# Patient Record
Sex: Female | Born: 1960 | Race: Black or African American | Hispanic: No | Marital: Single | Smoking: Never smoker
Health system: Southern US, Community
[De-identification: ages and names within clinical notes are randomized; demographics above are authoritative.]

---

## 1997-11-20 ENCOUNTER — Ambulatory Visit (HOSPITAL_BASED_OUTPATIENT_CLINIC_OR_DEPARTMENT_OTHER): Admission: RE | Admit: 1997-11-20 | Discharge: 1997-11-20 | Payer: Self-pay | Admitting: Ophthalmology

## 1997-12-10 ENCOUNTER — Other Ambulatory Visit: Admission: RE | Admit: 1997-12-10 | Discharge: 1997-12-10 | Payer: Self-pay | Admitting: Obstetrics and Gynecology

## 1999-07-12 ENCOUNTER — Other Ambulatory Visit: Admission: RE | Admit: 1999-07-12 | Discharge: 1999-07-12 | Payer: Self-pay | Admitting: Obstetrics and Gynecology

## 1999-11-25 ENCOUNTER — Ambulatory Visit (HOSPITAL_BASED_OUTPATIENT_CLINIC_OR_DEPARTMENT_OTHER): Admission: RE | Admit: 1999-11-25 | Discharge: 1999-11-25 | Payer: Self-pay | Admitting: Surgery

## 1999-11-25 ENCOUNTER — Encounter (INDEPENDENT_AMBULATORY_CARE_PROVIDER_SITE_OTHER): Payer: Self-pay | Admitting: *Deleted

## 2003-05-01 ENCOUNTER — Encounter: Admission: RE | Admit: 2003-05-01 | Discharge: 2003-07-16 | Payer: Self-pay | Admitting: Orthopedic Surgery

## 2003-10-31 ENCOUNTER — Encounter: Admission: RE | Admit: 2003-10-31 | Discharge: 2003-10-31 | Payer: Self-pay | Admitting: Orthopedic Surgery

## 2004-07-19 ENCOUNTER — Ambulatory Visit: Payer: Self-pay | Admitting: Internal Medicine

## 2004-08-01 ENCOUNTER — Ambulatory Visit: Payer: Self-pay | Admitting: Internal Medicine

## 2004-08-08 ENCOUNTER — Ambulatory Visit: Payer: Self-pay | Admitting: Internal Medicine

## 2004-08-09 LAB — CONVERTED CEMR LAB: Pap Smear: NORMAL

## 2004-08-23 ENCOUNTER — Encounter: Admission: RE | Admit: 2004-08-23 | Discharge: 2004-08-23 | Payer: Self-pay | Admitting: Internal Medicine

## 2004-08-25 ENCOUNTER — Other Ambulatory Visit: Admission: RE | Admit: 2004-08-25 | Discharge: 2004-08-25 | Payer: Self-pay | Admitting: Endocrinology

## 2004-08-25 ENCOUNTER — Ambulatory Visit: Payer: Self-pay | Admitting: Internal Medicine

## 2007-01-29 ENCOUNTER — Ambulatory Visit: Payer: Self-pay | Admitting: Internal Medicine

## 2007-01-29 LAB — CONVERTED CEMR LAB
ALT: 11 units/L (ref 0–35)
AST: 20 units/L (ref 0–37)
Bilirubin Urine: NEGATIVE
Calcium: 8.7 mg/dL (ref 8.4–10.5)
Cholesterol: 171 mg/dL (ref 0–200)
Eosinophils Absolute: 0.1 10*3/uL (ref 0.0–0.6)
GFR calc Af Amer: 138 mL/min
GFR calc non Af Amer: 114 mL/min
Glucose, Bld: 88 mg/dL (ref 70–99)
HCT: 30.8 % — ABNORMAL LOW (ref 36.0–46.0)
HDL: 65.3 mg/dL (ref >39.0)
Hemoglobin: 10.3 g/dL — ABNORMAL LOW (ref 12.0–15.0)
Ketones, ur: 15 mg/dL — AB
LDL Cholesterol: 93 mg/dL (ref 0–99)
Lymphocytes Relative: 32.2 % (ref 12.0–46.0)
Monocytes Absolute: 0.8 10*3/uL — ABNORMAL HIGH (ref 0.2–0.7)
Neutro Abs: 3.3 10*3/uL (ref 1.4–7.7)
Platelets: 340 10*3/uL (ref 150–400)
RBC: 3.45 M/uL — ABNORMAL LOW (ref 3.87–5.11)
Sodium: 141 meq/L (ref 135–145)
Total Bilirubin: 0.5 mg/dL (ref 0.3–1.2)
Total Protein: 6.3 g/dL (ref 6.0–8.3)
Triglycerides: 66 mg/dL (ref 0–149)
Urobilinogen, UA: 0.2 (ref 0.0–1.0)
VLDL: 13 mg/dL (ref 0–40)
pH: 6 (ref 5.0–8.0)

## 2007-01-30 ENCOUNTER — Ambulatory Visit: Payer: Self-pay | Admitting: Internal Medicine

## 2007-01-30 LAB — CONVERTED CEMR LAB
Folate: 9.2 ng/mL
Iron: 82 ug/dL (ref 42–145)
Retic Count, Absolute: 51.1 (ref 19.0–186.0)
Retic Ct Pct: 1.3 % (ref 0.4–3.1)
Saturation Ratios: 18.3 % — ABNORMAL LOW (ref 20.0–50.0)
Transferrin: 320.2 mg/dL (ref >=212.0)
Vitamin B-12: 472 pg/mL (ref 211–911)

## 2007-03-01 ENCOUNTER — Ambulatory Visit: Payer: Self-pay | Admitting: Internal Medicine

## 2008-07-30 ENCOUNTER — Ambulatory Visit: Payer: Self-pay | Admitting: *Deleted

## 2008-07-30 DIAGNOSIS — D509 Iron deficiency anemia, unspecified: Secondary | ICD-10-CM

## 2008-07-30 DIAGNOSIS — J069 Acute upper respiratory infection, unspecified: Secondary | ICD-10-CM | POA: Insufficient documentation

## 2008-07-30 LAB — CONVERTED CEMR LAB: Rapid Strep: NEGATIVE

## 2011-11-01 ENCOUNTER — Other Ambulatory Visit (INDEPENDENT_AMBULATORY_CARE_PROVIDER_SITE_OTHER): Payer: Self-pay

## 2011-11-01 DIAGNOSIS — Z0000 Encounter for general adult medical examination without abnormal findings: Secondary | ICD-10-CM

## 2011-11-01 LAB — LIPID PANEL
Cholesterol: 177 mg/dL (ref 0–200)
Total CHOL/HDL Ratio: 2
Triglycerides: 34 mg/dL (ref 0.0–149.0)
VLDL: 6.8 mg/dL (ref 0.0–40.0)

## 2011-11-01 LAB — POCT URINALYSIS DIPSTICK
Bilirubin, UA: NEGATIVE
Glucose, UA: NEGATIVE
Ketones, UA: NEGATIVE
Leukocytes, UA: NEGATIVE
Nitrite, UA: NEGATIVE
Protein, UA: NEGATIVE
Spec Grav, UA: 1.015
Urobilinogen, UA: 0.2

## 2011-11-01 LAB — HEPATIC FUNCTION PANEL: Total Bilirubin: 0.3 mg/dL (ref 0.3–1.2)

## 2011-11-01 LAB — BASIC METABOLIC PANEL
BUN: 8 mg/dL (ref 6–23)
Creatinine, Ser: 0.6 mg/dL (ref 0.4–1.2)
GFR: 125.62 mL/min (ref >60.00)
Potassium: 4.3 mEq/L (ref 3.5–5.1)

## 2011-11-01 LAB — CBC WITH DIFFERENTIAL/PLATELET
Basophils Absolute: 0 10*3/uL (ref 0.0–0.1)
Eosinophils Absolute: 0 10*3/uL (ref 0.0–0.7)
Eosinophils Relative: 0.4 % (ref 0.0–5.0)
Lymphocytes Relative: 28.7 % (ref 12.0–46.0)
Lymphs Abs: 1.6 10*3/uL (ref 0.7–4.0)
MCV: 94.6 fl (ref 78.0–100.0)
RBC: 3.84 Mil/uL — ABNORMAL LOW (ref 3.87–5.11)
WBC: 5.5 10*3/uL (ref 4.5–10.5)

## 2011-11-01 LAB — TSH: TSH: 1.37 u[IU]/mL (ref 0.35–5.50)

## 2011-11-07 ENCOUNTER — Ambulatory Visit (INDEPENDENT_AMBULATORY_CARE_PROVIDER_SITE_OTHER): Payer: BC Managed Care – PPO | Admitting: Internal Medicine

## 2011-11-07 ENCOUNTER — Encounter: Payer: Self-pay | Admitting: Internal Medicine

## 2011-11-07 VITALS — BP 112/68 | HR 88 | Temp 98.3°F | Ht 64.25 in | Wt 132.0 lb

## 2011-11-07 DIAGNOSIS — Z0000 Encounter for general adult medical examination without abnormal findings: Secondary | ICD-10-CM

## 2011-11-07 DIAGNOSIS — H5360 Unspecified night blindness: Secondary | ICD-10-CM

## 2011-11-07 NOTE — Assessment & Plan Note (Addendum)
Reviewed adult health maintenance protocols.  Refer for screening colonoscopy. Arrange DEXA scan and screening mammogram. Laboratory results reviewed. Her cholesterol panel is acceptable. Her electrolytes, kidney function and LFTs and thyroid function are normal.  Refer to ophthalmologist for evaluation of poor night vision. Patient may have cataracts.

## 2011-11-07 NOTE — Patient Instructions (Addendum)
Please follow up with your GYN for PAP/Pelvic exam We will contact you re: ophthalmology referral

## 2011-11-07 NOTE — Progress Notes (Signed)
  Subjective:    Patient ID: Jillian Flores, female    DOB: [birth year], 51 y.o.   MRN: [id number]  HPI  51 year old African American female to reestablish and for routine physical. Has been since 2006 or 2007 since her last office visit. At previous physical there was concern for possible GYN tumor. She was referred to Dr. Pennie Rushing. Patient reports growth was removed and reported benign. She has not had regular GYN followup. She has also not had her mammogram since 2006. She denies family history of colon cancer but has not yet had her colonoscopy. She denies any bowel changes.  Her menstrual cycle stopped approximately 2 years ago. Status of bone density is unknown.   Review of Systems   Constitutional: Negative for activity change, appetite change and unexpected weight change.  Eyes: Negative for visual disturbance.  she complains of poor night vision Respiratory: Negative for cough, chest tightness and shortness of breath.   Cardiovascular: Negative for chest pain.  Genitourinary: Negative for difficulty urinating.  Neurological: Negative for headaches.  Gastrointestinal: Negative for abdominal pain, heartburn melena or hematochezia Psych: Negative for depression or anxiety  No past medical history on file.  History   Social History  . Marital Status: Single    Spouse Name: N/A    Number of Children: N/A  . Years of Education: N/A   Occupational History  . Preschool teacher    Social History Main Topics  . Smoking status: Never Smoker   . Smokeless tobacco: Not on file  . Alcohol Use: No  . Drug Use: No  . Sexually Active: Not on file   Other Topics Concern  . Not on file   Social History Narrative  . No narrative on file    No past surgical history on file.  Family History  Problem Relation Age of Onset  . Cancer Mother 49    Type of cancer unknown    No Known Allergies  No current outpatient prescriptions on file prior to visit.    BP 112/68  Pulse  88  Temp(Src) 98.3 F (36.8 C) (Oral)  Ht 5' 4.25" (1.632 m)  Wt 132 lb (59.875 kg)  BMI 22.48 kg/m2        Objective:   Physical Exam  Constitutional: She is oriented to person, place, and time. She appears well-developed and well-nourished. No distress.  HENT:  Head: Normocephalic and atraumatic.  Right Ear: External ear normal.  Left Ear: External ear normal.  Mouth/Throat: Oropharynx is clear and moist.  Eyes: Conjunctivae are normal. Pupils are equal, round, and reactive to light.  Neck: Normal range of motion. Neck supple. No thyromegaly present.       No carotid bruit  Cardiovascular: Normal rate, regular rhythm, normal heart sounds and intact distal pulses.   No murmur heard. Pulmonary/Chest: Effort normal and breath sounds normal. She has no wheezes.  Abdominal: Soft. Bowel sounds are normal. She exhibits no distension and no mass. There is no tenderness.  Musculoskeletal: She exhibits no edema.  Lymphadenopathy:    She has no cervical adenopathy.  Neurological: She is alert and oriented to person, place, and time.  Skin: Skin is warm and dry.  Psychiatric: She has a normal mood and affect. Her behavior is normal.       Assessment & Plan:

## 2011-11-08 ENCOUNTER — Encounter: Payer: Self-pay | Admitting: Internal Medicine

## 2011-11-08 ENCOUNTER — Ambulatory Visit (INDEPENDENT_AMBULATORY_CARE_PROVIDER_SITE_OTHER)
Admission: RE | Admit: 2011-11-08 | Discharge: 2011-11-08 | Disposition: A | Discharge status: Home / Self Care | Payer: BC Managed Care – PPO | Source: Ambulatory Visit | Attending: Internal Medicine | Admitting: Internal Medicine

## 2011-11-08 DIAGNOSIS — Z0000 Encounter for general adult medical examination without abnormal findings: Secondary | ICD-10-CM

## 2011-11-09 ENCOUNTER — Ambulatory Visit
Admission: RE | Admit: 2011-11-09 | Discharge: 2011-11-09 | Disposition: A | Discharge status: Home / Self Care | Payer: BC Managed Care – PPO | Source: Ambulatory Visit | Attending: Internal Medicine | Admitting: Internal Medicine

## 2011-11-09 DIAGNOSIS — Z0000 Encounter for general adult medical examination without abnormal findings: Secondary | ICD-10-CM

## 2011-11-13 ENCOUNTER — Encounter: Payer: Self-pay | Admitting: Internal Medicine

## 2011-12-26 ENCOUNTER — Encounter: Payer: BC Managed Care – PPO | Admitting: Internal Medicine

## 2012-01-22 ENCOUNTER — Encounter: Payer: Self-pay | Admitting: Gastroenterology

## 2012-11-01 ENCOUNTER — Other Ambulatory Visit: Payer: BC Managed Care – PPO

## 2012-11-08 ENCOUNTER — Encounter: Payer: BC Managed Care – PPO | Admitting: Internal Medicine

## 2013-03-04 ENCOUNTER — Other Ambulatory Visit (INDEPENDENT_AMBULATORY_CARE_PROVIDER_SITE_OTHER): Payer: BC Managed Care – PPO

## 2013-03-04 DIAGNOSIS — Z0000 Encounter for general adult medical examination without abnormal findings: Secondary | ICD-10-CM

## 2013-03-11 ENCOUNTER — Encounter: Payer: BC Managed Care – PPO | Admitting: Internal Medicine

## 2014-09-07 ENCOUNTER — Other Ambulatory Visit (INDEPENDENT_AMBULATORY_CARE_PROVIDER_SITE_OTHER): Payer: BLUE CROSS/BLUE SHIELD

## 2014-09-07 DIAGNOSIS — Z0000 Encounter for general adult medical examination without abnormal findings: Secondary | ICD-10-CM | POA: Diagnosis not present

## 2014-09-07 LAB — CBC WITH DIFFERENTIAL/PLATELET
Basophils Absolute: 0 10*3/uL (ref 0.0–0.1)
Basophils Relative: 0.3 % (ref 0.0–3.0)
EOS PCT: 0.8 % (ref 0.0–5.0)
Eosinophils Absolute: 0 10*3/uL (ref 0.0–0.7)
HEMATOCRIT: 34.6 % — AB (ref 36.0–46.0)
HEMOGLOBIN: 11.8 g/dL — AB (ref 12.0–15.0)
LYMPHS ABS: 2.2 10*3/uL (ref 0.7–4.0)
Lymphocytes Relative: 38.1 % (ref 12.0–46.0)
MCHC: 34.2 g/dL (ref 30.0–36.0)
MCV: 91.4 fl (ref 78.0–100.0)
MONOS PCT: 9.1 % (ref 3.0–12.0)
Monocytes Absolute: 0.5 10*3/uL (ref 0.1–1.0)
Neutro Abs: 3.1 10*3/uL (ref 1.4–7.7)
Neutrophils Relative %: 51.7 % (ref 43.0–77.0)
PLATELETS: 299 10*3/uL (ref 150.0–400.0)
RBC: 3.79 Mil/uL — ABNORMAL LOW (ref 3.87–5.11)
RDW: 13.5 % (ref 11.5–15.5)
WBC: 5.9 10*3/uL (ref 4.0–10.5)

## 2014-09-07 LAB — COMPREHENSIVE METABOLIC PANEL WITH GFR
ALT: 15 U/L (ref 0–35)
AST: 35 U/L (ref 0–37)
Albumin: 4.2 g/dL (ref 3.5–5.2)
Alkaline Phosphatase: 60 U/L (ref 39–117)
BUN: 8 mg/dL (ref 6–23)
CO2: 30 meq/L (ref 19–32)
Calcium: 9.8 mg/dL (ref 8.4–10.5)
Chloride: 104 meq/L (ref 96–112)
Creatinine, Ser: 0.7 mg/dL (ref 0.40–1.20)
GFR: 112.04 mL/min
Glucose, Bld: 82 mg/dL (ref 70–99)
Potassium: 4.2 meq/L (ref 3.5–5.1)
Sodium: 137 meq/L (ref 135–145)
Total Bilirubin: 0.5 mg/dL (ref 0.2–1.2)
Total Protein: 7.3 g/dL (ref 6.0–8.3)

## 2014-09-07 LAB — LIPID PANEL
Cholesterol: 210 mg/dL — ABNORMAL HIGH (ref 0–200)
HDL: 81.4 mg/dL
LDL Cholesterol: 122 mg/dL — ABNORMAL HIGH (ref 0–99)
NonHDL: 128.6
Total CHOL/HDL Ratio: 3
Triglycerides: 31 mg/dL (ref 0.0–149.0)
VLDL: 6.2 mg/dL (ref 0.0–40.0)

## 2014-09-07 LAB — POCT URINALYSIS DIPSTICK
Bilirubin, UA: NEGATIVE
Blood, UA: NEGATIVE
Glucose, UA: NEGATIVE
Ketones, UA: NEGATIVE
Leukocytes, UA: NEGATIVE
Nitrite, UA: NEGATIVE
Protein, UA: NEGATIVE
Spec Grav, UA: 1.01
Urobilinogen, UA: 0.2
pH, UA: 6.5

## 2014-09-07 LAB — TSH: TSH: 1.56 u[IU]/mL (ref 0.35–4.50)

## 2014-09-11 ENCOUNTER — Ambulatory Visit (INDEPENDENT_AMBULATORY_CARE_PROVIDER_SITE_OTHER): Payer: BLUE CROSS/BLUE SHIELD | Admitting: Internal Medicine

## 2014-09-11 ENCOUNTER — Encounter: Payer: Self-pay | Admitting: Internal Medicine

## 2014-09-11 VITALS — BP 110/78 | HR 64 | Temp 98.6°F | Ht 64.25 in | Wt 134.0 lb

## 2014-09-11 DIAGNOSIS — Z0000 Encounter for general adult medical examination without abnormal findings: Secondary | ICD-10-CM | POA: Diagnosis not present

## 2014-09-11 DIAGNOSIS — Z780 Asymptomatic menopausal state: Secondary | ICD-10-CM

## 2014-09-11 DIAGNOSIS — Z23 Encounter for immunization: Secondary | ICD-10-CM | POA: Diagnosis not present

## 2014-09-11 DIAGNOSIS — Z1211 Encounter for screening for malignant neoplasm of colon: Secondary | ICD-10-CM | POA: Diagnosis not present

## 2014-09-11 DIAGNOSIS — Z1239 Encounter for other screening for malignant neoplasm of breast: Secondary | ICD-10-CM | POA: Diagnosis not present

## 2014-09-11 NOTE — Progress Notes (Signed)
   Subjective:    Patient ID: Jillian Flores, female    DOB: December 08, 1960, 54 y.o.   MRN: [id number]  HPI  54 year old [place] for routine physical. Patient denies any significant interval medical history. Patient due for screening mammogram. She plans to see her gynecologist for breast exam, and Pap and pelvic.  She is due for screening colonoscopy.    Review of Systems  Constitutional: Negative for activity change, appetite change and unexpected weight change.  Eyes: Negative for visual disturbance.  Respiratory: Negative for cough, chest tightness and shortness of breath.   Cardiovascular: Negative for chest pain.  Genitourinary: Negative for difficulty urinating.  Neurological: Negative for headaches.  Gastrointestinal: Negative for abdominal pain, heartburn melena or hematochezia Psych: Negative for depression or anxiety Endo:  No polyuria or polydypsia        No past medical history on file.  History   Social History  . Marital Status: Single    Spouse Name: N/A  . Number of Children: N/A  . Years of Education: N/A   Occupational History  . Preschool teacher    Social History Main Topics  . Smoking status: Never Smoker   . Smokeless tobacco: Not on file  . Alcohol Use: No  . Drug Use: No  . Sexual Activity: Not on file   Other Topics Concern  . Not on file   Social History Narrative    No past surgical history on file.  Family History  Problem Relation Age of Onset  . Cancer Mother 6262    Type of cancer unknown    No Known Allergies  Current Outpatient Prescriptions on File Prior to Visit  Medication Sig Dispense Refill  . Multiple Vitamin (MULTIVITAMIN) tablet Take 1 tablet by mouth daily.     No current facility-administered medications on file prior to visit.    BP 110/78 mmHg  Pulse 64  Temp(Src) 98.6 F (37 C) (Oral)  Ht 5' 4.25" (1.632 m)  Wt 134 lb (60.782 kg)  BMI 22.82 kg/m2    Objective:   Physical Exam    Constitutional: She is oriented to person, place, and time. She appears well-developed and well-nourished. No distress.  HENT:  Head: Normocephalic and atraumatic.  Right Ear: External ear normal.  Left Ear: External ear normal.  Mouth/Throat: Oropharynx is clear and moist. No oropharyngeal exudate.  Eyes: Conjunctivae and EOM are normal. Pupils are equal, round, and reactive to light.  Neck: Neck supple. No thyromegaly present.  Cardiovascular: Normal rate, regular rhythm, normal heart sounds and intact distal pulses.   No murmur heard. Pulmonary/Chest: Effort normal and breath sounds normal. No respiratory distress. She has no wheezes.  Abdominal: Soft. Bowel sounds are normal. She exhibits no distension and no mass. There is no tenderness.  Musculoskeletal: She exhibits no edema.  Lymphadenopathy:    She has no cervical adenopathy.  Neurological: She is alert and oriented to person, place, and time.  Skin: Skin is warm and dry.  Psychiatric: She has a normal mood and affect. Her behavior is normal.          Assessment & Plan:

## 2014-09-11 NOTE — Progress Notes (Signed)
Pre visit review using our clinic review tool, if applicable. No additional management support is needed unless otherwise documented below in the visit note. 

## 2014-09-12 NOTE — Assessment & Plan Note (Signed)
Reviewed adult health maintenance protocols.  Patient has mild normocytic anemia. Considering her age we discussed first obtaining iFOB. Set up screening colonoscopy with Dr. Rhea BeltonPyrtle.  Arrange screening mammogram. She plans to follow-up with Dr. Pennie RushingHaygood for breast exam, Pap and pelvic.  Arrange screening bone density.  Reviewed adult vaccines.  Patient updated with Tdap.

## 2014-09-14 ENCOUNTER — Encounter: Payer: Self-pay | Admitting: Internal Medicine

## 2014-09-16 ENCOUNTER — Inpatient Hospital Stay: Admission: RE | Admit: 2014-09-16 | Payer: BLUE CROSS/BLUE SHIELD | Source: Ambulatory Visit

## 2014-10-02 ENCOUNTER — Encounter: Payer: Self-pay | Admitting: Gastroenterology

## 2014-10-06 ENCOUNTER — Telehealth: Payer: Self-pay | Admitting: Internal Medicine

## 2014-10-06 NOTE — Telephone Encounter (Signed)
Pt's bone density needs to be rescheduled for the [place] location, Lm for pt to [facility] and [facility]

## 2014-10-07 ENCOUNTER — Inpatient Hospital Stay: Admission: RE | Admit: 2014-10-07 | Payer: BLUE CROSS/BLUE SHIELD | Source: Ambulatory Visit

## 2014-10-28 ENCOUNTER — Telehealth: Payer: Self-pay

## 2014-10-28 NOTE — Telephone Encounter (Signed)
[facility]

## 2014-11-02 ENCOUNTER — Inpatient Hospital Stay: Admission: RE | Admit: 2014-11-02 | Payer: BLUE CROSS/BLUE SHIELD | Source: Ambulatory Visit

## 2014-11-02 ENCOUNTER — Other Ambulatory Visit: Payer: BLUE CROSS/BLUE SHIELD

## 2014-11-05 ENCOUNTER — Encounter: Payer: Self-pay | Admitting: Family Medicine

## 2014-11-05 ENCOUNTER — Ambulatory Visit (INDEPENDENT_AMBULATORY_CARE_PROVIDER_SITE_OTHER): Payer: BLUE CROSS/BLUE SHIELD | Admitting: Family Medicine

## 2014-11-05 VITALS — BP 110/70 | Temp 102.3°F

## 2014-11-05 DIAGNOSIS — J111 Influenza due to unidentified influenza virus with other respiratory manifestations: Secondary | ICD-10-CM | POA: Insufficient documentation

## 2014-11-05 DIAGNOSIS — J1189 Influenza due to unidentified influenza virus with other manifestations: Secondary | ICD-10-CM

## 2014-11-05 MED ORDER — HYDROCODONE-HOMATROPINE 5-1.5 MG/5ML PO SYRP: 5.0000 mL | ORAL_SOLUTION | Freq: Three times a day (TID) | ORAL | Status: AC | PRN

## 2014-11-05 NOTE — Progress Notes (Signed)
Pre visit review using our clinic review tool, if applicable. No additional management support is needed unless otherwise documented below in the visit note. 

## 2014-11-05 NOTE — Progress Notes (Signed)
   Subjective:    Patient ID: Jillian Flores, female    DOB: [birth year], 54 y.o.   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 HEENT were negative neck was supple no adenopathy lungs are clear to auscultation      Assessment & Plan:  Influenza type illness........Marland Kitchen. rest at home...Marland Kitchen.Marland Kitchen.Marland Kitchen. lots of liquids....... Tylenol for fever chills....... Hydromet for cough

## 2014-11-05 NOTE — Patient Instructions (Signed)
Rest at home  Drink lots of liquids if you don't feel like eating  Tylenol......... 2 tabs 3 times daily for fever chills  Hydromet.........Marland Kitchen. 1/2-1 teaspoon 3 times daily for cough and cold symptoms  Return to work on Tuesday  Call if any problems

## 2021-06-21 ENCOUNTER — Other Ambulatory Visit: Payer: Self-pay | Admitting: Obstetrics and Gynecology

## 2021-06-21 DIAGNOSIS — R928 Other abnormal and inconclusive findings on diagnostic imaging of breast: Secondary | ICD-10-CM

## 2021-07-04 ENCOUNTER — Ambulatory Visit: Payer: BLUE CROSS/BLUE SHIELD

## 2021-07-04 ENCOUNTER — Ambulatory Visit
Admission: RE | Admit: 2021-07-04 | Discharge: 2021-07-04 | Disposition: A | Discharge status: Home / Self Care | Payer: Self-pay | Source: Ambulatory Visit | Attending: Obstetrics and Gynecology | Admitting: Obstetrics and Gynecology

## 2021-07-04 DIAGNOSIS — R928 Other abnormal and inconclusive findings on diagnostic imaging of breast: Secondary | ICD-10-CM

## 2021-07-19 ENCOUNTER — Other Ambulatory Visit: Payer: BLUE CROSS/BLUE SHIELD

## 2023-02-06 IMAGING — MG MM DIGITAL DIAGNOSTIC UNILAT*R* W/ TOMO W/ CAD
6 series · 6 of 18 positions shown · non-contrast
Comparison: Previous exam(s).

CLINICAL DATA: Screening recall for a right breast asymmetry.

EXAM:
DIGITAL DIAGNOSTIC UNILATERAL RIGHT MAMMOGRAM WITH TOMOSYNTHESIS AND
CAD
TECHNIQUE: Right digital diagnostic mammography and breast tomosynthesis was
performed. The images were evaluated with computer-aided detection.

[R ML synth-2D]
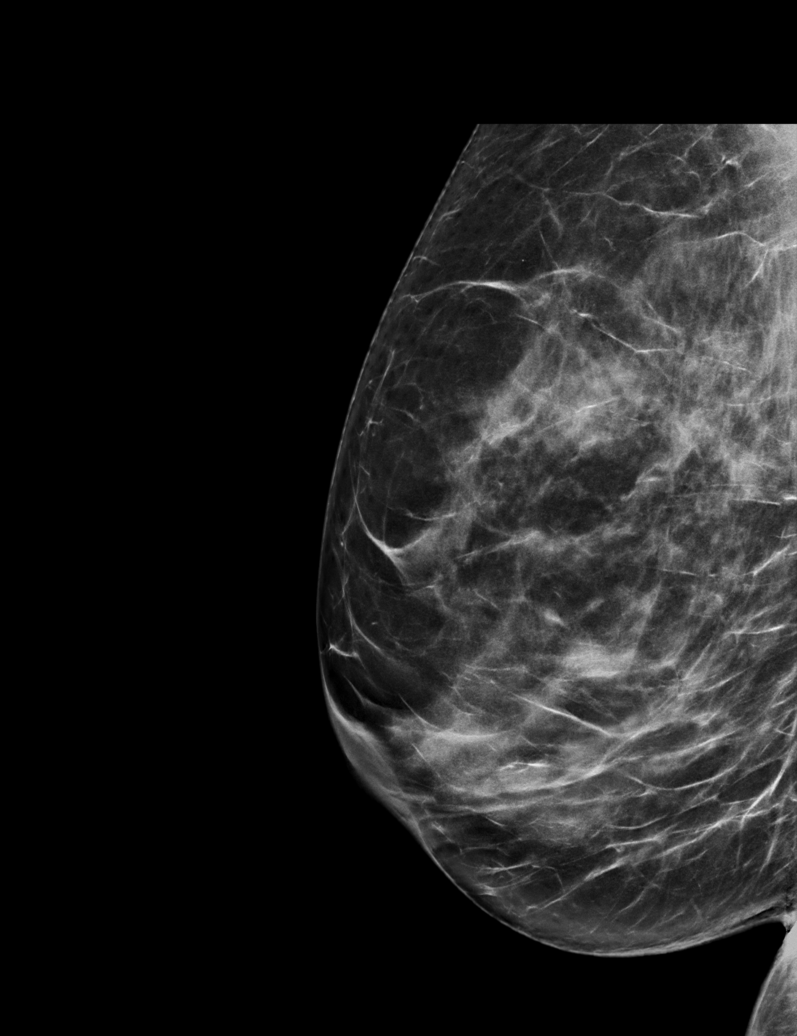

[R MLO synth-2D (1 of 2)]
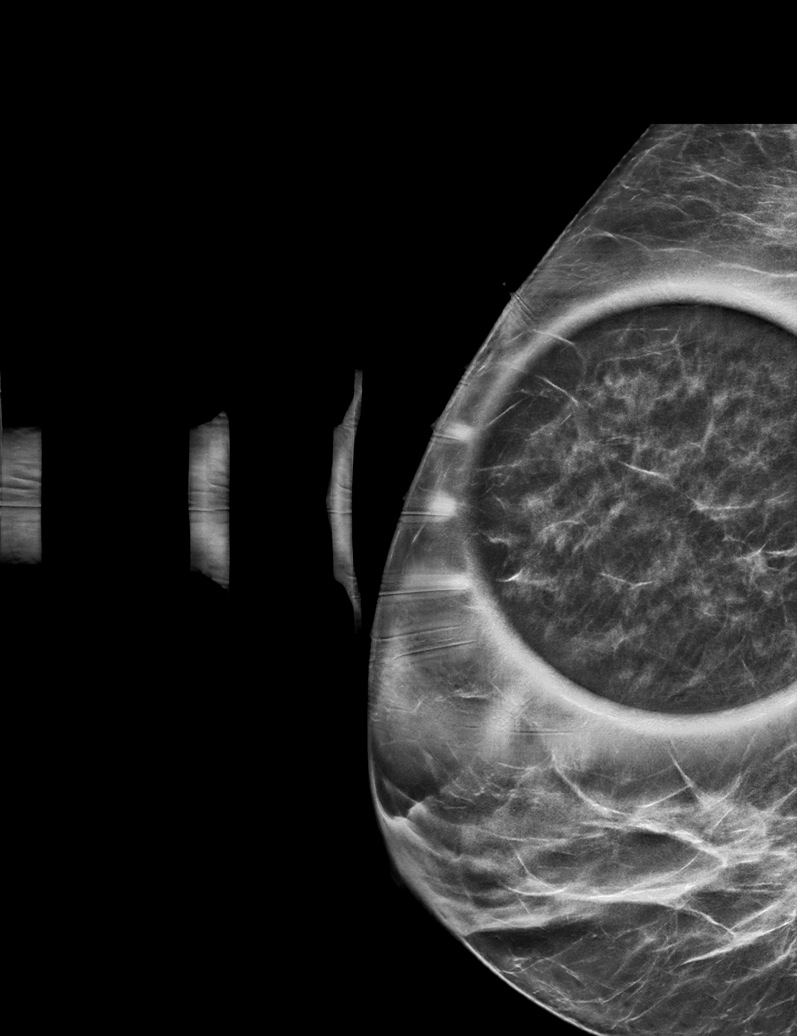

[R MLO synth-2D (2 of 2)]
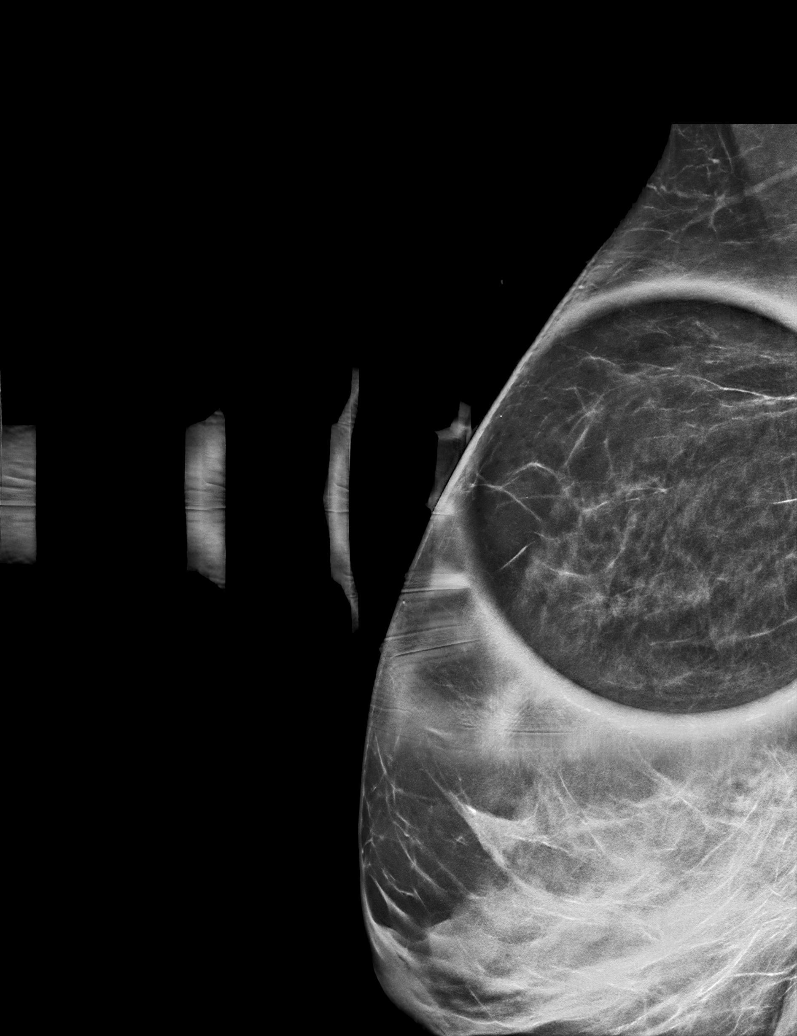

[R MLO tomo (1 of 2) · tomo slice 35/70.0]
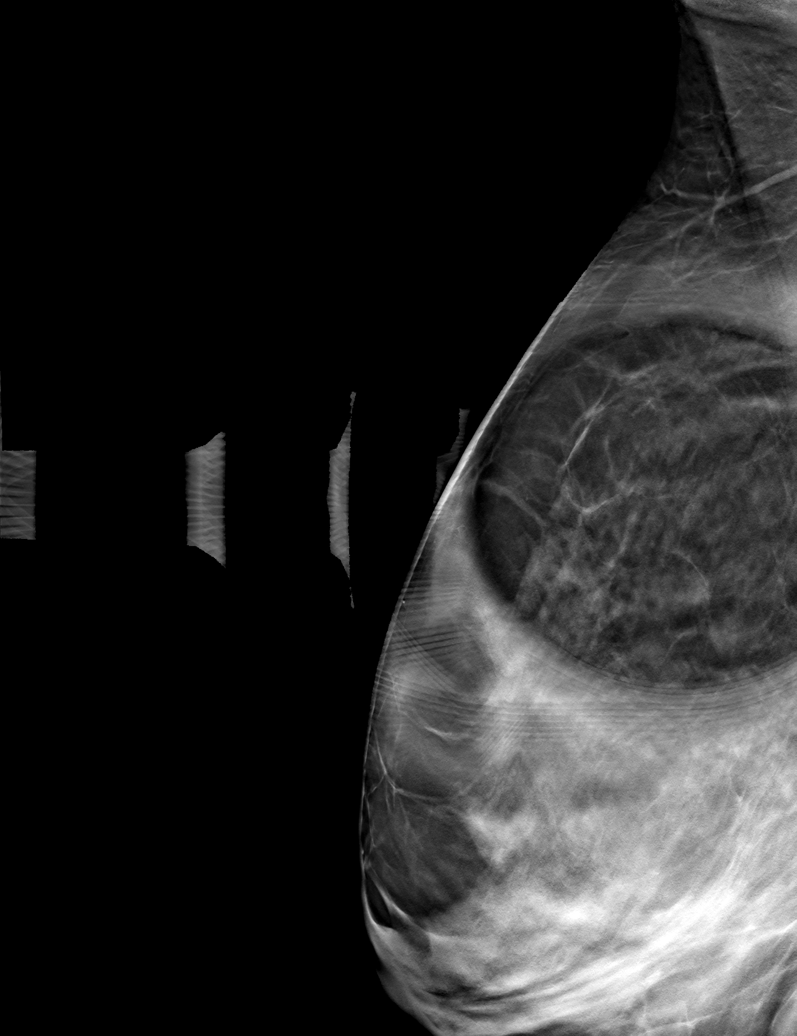

[R ML tomo · tomo slice 37/72.0]
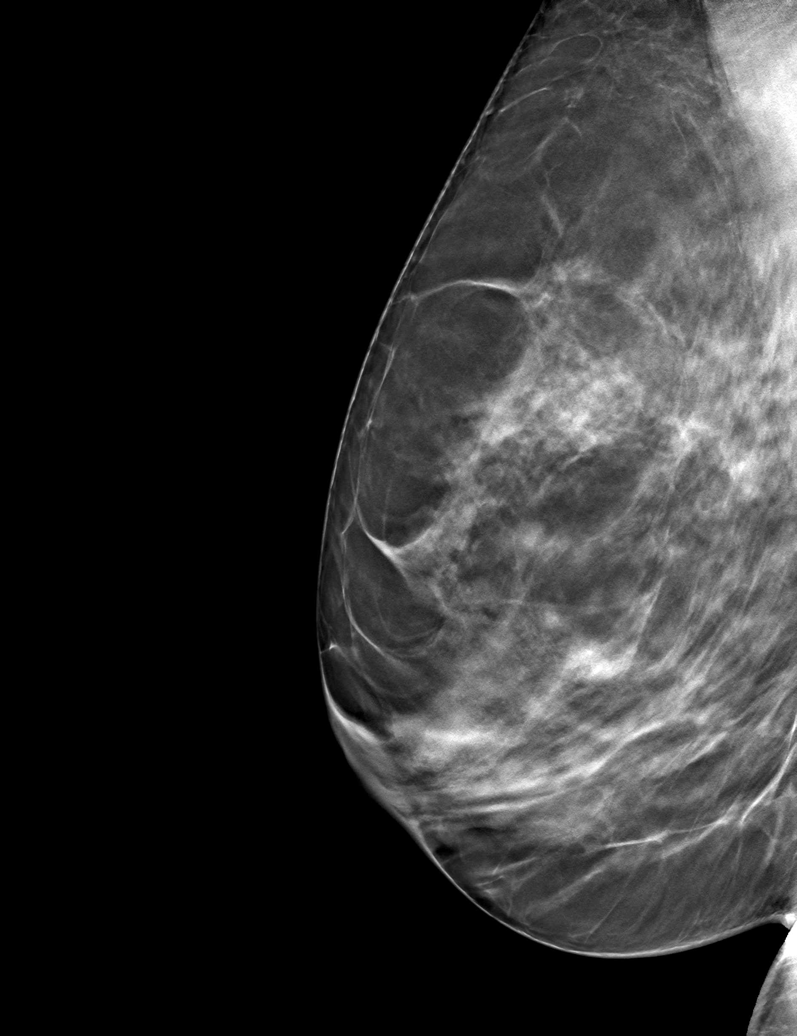

[R MLO tomo (2 of 2) · tomo slice 33/66.0]
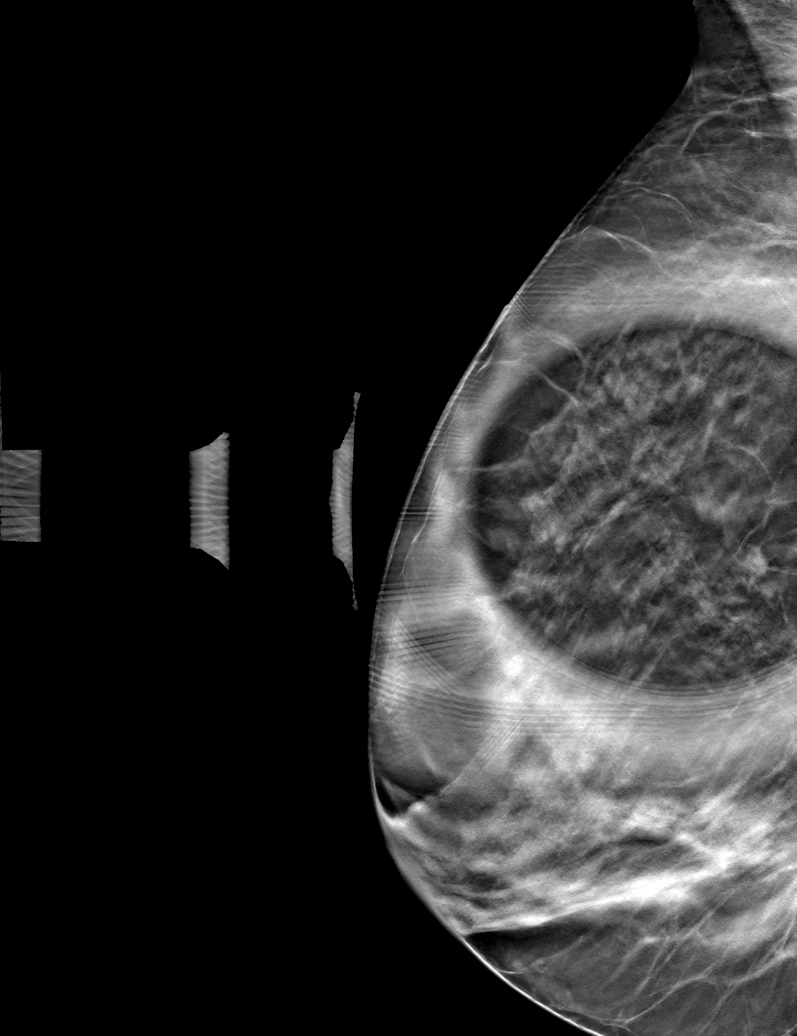

[6 of 18 positions shown; findings below may reference images not displayed]

ACR Breast Density Category c: The breast tissue is heterogeneously
dense, which may obscure small masses.
FINDINGS: The asymmetry in the superior right breast resolves with spot
compression tomosynthesis imaging. No suspicious findings are
identified on the full paddle true lateral tomosynthesis images.
IMPRESSION: Resolution of the right breast asymmetry consistent with overlapping
fibroglandular tissue.

RECOMMENDATION:
Screening mammogram in one year.(Code:[id number])

I have discussed the findings and recommendations with the patient.
If applicable, a reminder letter will be sent to the patient
regarding the next appointment.

BI-RADS CATEGORY  1: Negative.

## 2024-06-10 LAB — AMB RESULTS CONSOLE CBG: Glucose: 101
